# Patient Record
Sex: Male | Born: 1945 | Race: White | Hispanic: No | Marital: Married
Health system: Southern US, Community
[De-identification: ages and names within clinical notes are randomized; demographics above are authoritative.]

---

## 2016-01-29 ENCOUNTER — Inpatient Hospital Stay
Admission: AD | Admit: 2016-01-29 | Discharge: 2016-02-14 | Disposition: E | Payer: Self-pay | Source: Ambulatory Visit | Attending: Internal Medicine | Admitting: Internal Medicine

## 2016-01-29 ENCOUNTER — Other Ambulatory Visit (HOSPITAL_COMMUNITY): Payer: Self-pay

## 2016-01-29 DIAGNOSIS — I469 Cardiac arrest, cause unspecified: Secondary | ICD-10-CM

## 2016-01-29 DIAGNOSIS — Z431 Encounter for attention to gastrostomy: Secondary | ICD-10-CM

## 2016-01-29 DIAGNOSIS — J9690 Respiratory failure, unspecified, unspecified whether with hypoxia or hypercapnia: Secondary | ICD-10-CM

## 2016-01-29 MED ORDER — IOPAMIDOL (ISOVUE-300) INJECTION 61%: 50.0000 mL | Freq: Once | INTRAVENOUS | Status: DC | PRN

## 2016-01-29 MED ORDER — IOPAMIDOL (ISOVUE-300) INJECTION 61%
INTRAVENOUS | Status: AC
  Administered 2016-01-29: 50 mL via GASTROSTOMY
  Filled 2016-01-29: qty 50

## 2016-01-30 LAB — COMPREHENSIVE METABOLIC PANEL
ALT: 163 U/L — AB (ref 17–63)
AST: 66 U/L — AB (ref 15–41)
Albumin: 3.2 g/dL — ABNORMAL LOW (ref 3.5–5.0)
Alkaline Phosphatase: 121 U/L (ref 38–126)
Anion gap: 10 (ref 5–15)
BILIRUBIN TOTAL: 0.8 mg/dL (ref 0.3–1.2)
BUN: 43 mg/dL — ABNORMAL HIGH (ref 6–20)
CHLORIDE: 102 mmol/L (ref 101–111)
CO2: 27 mmol/L (ref 22–32)
CREATININE: 1.07 mg/dL (ref 0.61–1.24)
Calcium: 9 mg/dL (ref 8.9–10.3)
Glucose, Bld: 106 mg/dL — ABNORMAL HIGH (ref 65–99)
Potassium: 3.7 mmol/L (ref 3.5–5.1)
SODIUM: 139 mmol/L (ref 135–145)
TOTAL PROTEIN: 6.1 g/dL — AB (ref 6.5–8.1)

## 2016-01-30 LAB — CBC WITH DIFFERENTIAL/PLATELET
BASOS ABS: 0 10*3/uL (ref 0.0–0.1)
BASOS PCT: 0 %
EOS PCT: 0 %
Eosinophils Absolute: 0.1 10*3/uL (ref 0.0–0.7)
HCT: 29.3 % — ABNORMAL LOW (ref 39.0–52.0)
HEMOGLOBIN: 9.5 g/dL — AB (ref 13.0–17.0)
Lymphocytes Relative: 11 %
Lymphs Abs: 1.7 10*3/uL (ref 0.7–4.0)
MCH: 28.8 pg (ref 26.0–34.0)
MCHC: 32.4 g/dL (ref 30.0–36.0)
MCV: 88.8 fL (ref 78.0–100.0)
Monocytes Absolute: 0.7 10*3/uL (ref 0.1–1.0)
Monocytes Relative: 4 %
NEUTROS PCT: 85 %
Neutro Abs: 12.7 10*3/uL — ABNORMAL HIGH (ref 1.7–7.7)
PLATELETS: 204 10*3/uL (ref 150–400)
RBC: 3.3 MIL/uL — AB (ref 4.22–5.81)
RDW: 16.8 % — ABNORMAL HIGH (ref 11.5–15.5)
WBC: 15.1 10*3/uL — AB (ref 4.0–10.5)

## 2016-01-30 LAB — URINALYSIS, ROUTINE W REFLEX MICROSCOPIC
BILIRUBIN URINE: NEGATIVE
Glucose, UA: NEGATIVE mg/dL
Ketones, ur: NEGATIVE mg/dL
NITRITE: NEGATIVE
PH: 7 (ref 5.0–8.0)
Protein, ur: 30 mg/dL — AB
SPECIFIC GRAVITY, URINE: 1.018 (ref 1.005–1.030)

## 2016-01-30 LAB — MAGNESIUM: Magnesium: 2.2 mg/dL (ref 1.7–2.4)

## 2016-01-30 LAB — PROTIME-INR
INR: 1.07
PROTHROMBIN TIME: 13.9 s (ref 11.4–15.2)

## 2016-01-30 LAB — TSH: TSH: 23.877 u[IU]/mL — ABNORMAL HIGH (ref 0.350–4.500)

## 2016-01-30 LAB — PHOSPHORUS: Phosphorus: 3.6 mg/dL (ref 2.5–4.6)

## 2016-01-30 LAB — HEMOGLOBIN A1C
HEMOGLOBIN A1C: 6.3 % — AB (ref 4.8–5.6)
Mean Plasma Glucose: 134 mg/dL

## 2016-01-31 LAB — BASIC METABOLIC PANEL
ANION GAP: 9 (ref 5–15)
BUN: 52 mg/dL — AB (ref 6–20)
CO2: 29 mmol/L (ref 22–32)
Calcium: 8.6 mg/dL — ABNORMAL LOW (ref 8.9–10.3)
Chloride: 102 mmol/L (ref 101–111)
Creatinine, Ser: 1.09 mg/dL (ref 0.61–1.24)
GFR calc Af Amer: >60 mL/min (ref >60)
GLUCOSE: 121 mg/dL — AB (ref 65–99)
POTASSIUM: 3.6 mmol/L (ref 3.5–5.1)
Sodium: 140 mmol/L (ref 135–145)

## 2016-01-31 LAB — CBC WITH DIFFERENTIAL/PLATELET
Basophils Absolute: 0 10*3/uL (ref 0.0–0.1)
Basophils Relative: 0 %
EOS PCT: 1 %
Eosinophils Absolute: 0.1 10*3/uL (ref 0.0–0.7)
HEMATOCRIT: 28.4 % — AB (ref 39.0–52.0)
Hemoglobin: 9.2 g/dL — ABNORMAL LOW (ref 13.0–17.0)
LYMPHS PCT: 14 %
Lymphs Abs: 1.6 10*3/uL (ref 0.7–4.0)
MCH: 28.8 pg (ref 26.0–34.0)
MCHC: 32.4 g/dL (ref 30.0–36.0)
MCV: 88.8 fL (ref 78.0–100.0)
MONO ABS: 0.6 10*3/uL (ref 0.1–1.0)
Monocytes Relative: 5 %
NEUTROS ABS: 9.7 10*3/uL — AB (ref 1.7–7.7)
Neutrophils Relative %: 80 %
PLATELETS: 224 10*3/uL (ref 150–400)
RBC: 3.2 MIL/uL — AB (ref 4.22–5.81)
RDW: 17 % — ABNORMAL HIGH (ref 11.5–15.5)
WBC: 12 10*3/uL — AB (ref 4.0–10.5)

## 2016-01-31 LAB — T4, FREE: FREE T4: 0.93 ng/dL (ref 0.61–1.12)

## 2016-01-31 LAB — PHOSPHORUS: Phosphorus: 3.2 mg/dL (ref 2.5–4.6)

## 2016-01-31 LAB — MAGNESIUM: Magnesium: 2.5 mg/dL — ABNORMAL HIGH (ref 1.7–2.4)

## 2016-02-02 ENCOUNTER — Other Ambulatory Visit (HOSPITAL_COMMUNITY): Payer: Self-pay

## 2016-02-02 LAB — URINE CULTURE: Culture: >=100000 — AB

## 2016-02-06 MED FILL — Medication: Qty: 1 | Status: AC

## 2016-02-14 NOTE — Progress Notes (Signed)
CODE BLUE NOTE  Patient Name: Benjamin Mata  MRN: [id number]  Date of Birth/ Sex: Jul 04, 1945     Admission Date: 01/25/2016  Attending Provider: Carron CurieAli, Hijazi, MD   Primary Diagnosis: CVA    Indication: Pt was in her usual state of health until this PM, when she was noted to be bradycardic. Code blue was subsequently called. At the time of arrival on scene, ACLS protocol was underway.   Technical Description:  - CPR performance duration:  30 minute  - Was defibrillation or cardioversion used? Yes   - Was external pacer placed? Yes  - Was patient intubated pre/post CPR? Yes    Medications Administered: Y = Yes; Blank = No Amiodarone    Atropine    Calcium  1  Epinephrine  8  Lidocaine    Magnesium  1  Norepinephrine    Phenylephrine    Sodium bicarbonate  3  Vasopressin    Other     Post CPR evaluation:  - Final Status - Was patient successfully resuscitated ? No   Miscellaneous Information:  - Time of death:  5:50 AM  - Primary team notified?  Yes  - Family Notified? Yes       [name], MD Family Medicine, PGY-1 6:20 AM

## 2016-02-14 DEATH — deceased

## 2017-07-31 IMAGING — CR DG CHEST 1V PORT
1 series · 1 of 1 positions shown · non-contrast
Comparison: None.

CLINICAL DATA: PEG tube placement.

EXAM:
PORTABLE CHEST 1 VIEW

[AP]
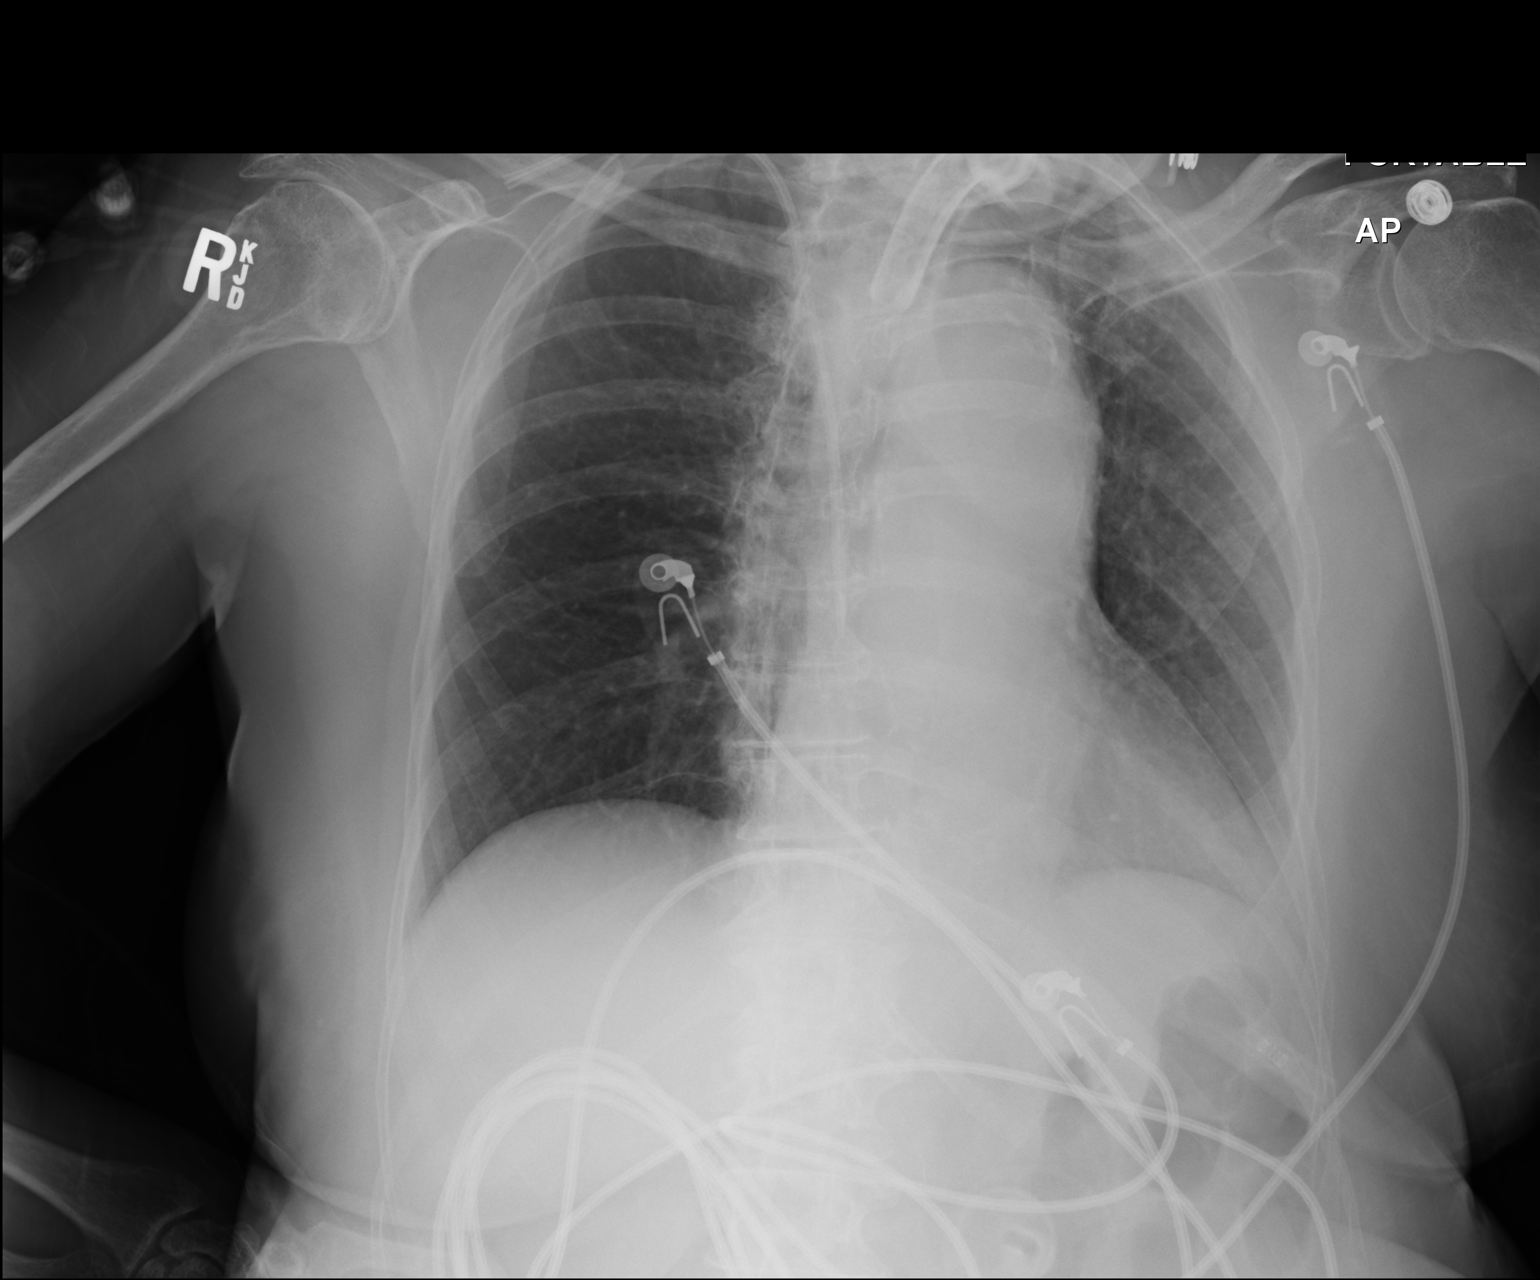

[1 of 1 positions shown; findings below may reference images not displayed]

FINDINGS: Heart is top normal. There is aortic atherosclerosis. Probable
ectasia of the thoracic aorta along its descending portion. Mild
aneurysmal dilatation not entirely excluded. Patient is slightly
rotated on current exam. Tracheostomy tube tip is seen along the
expected course of the trachea at the level of the aortic arch.
Right-sided central line catheter is seen in the distal SVC. Lungs
are free of pneumonic consolidations. No pneumothorax nor effusion.
PEG tube partially imaged in the left upper quadrant of the abdomen.
IMPRESSION: Satisfactory support line and tube positions. No acute pulmonary
disease. Aortic atherosclerosis. Ectatic thoracic aorta. Mild
aneurysmal dilatation of the descending portion not entirely
excluded.

## 2017-07-31 IMAGING — CR DG ABD PORTABLE 1V
1 series · 1 of 1 positions shown · non-contrast
Comparison: None.

CLINICAL DATA: PEG tube placement.

EXAM:
PORTABLE ABDOMEN - 1 VIEW

[AP]
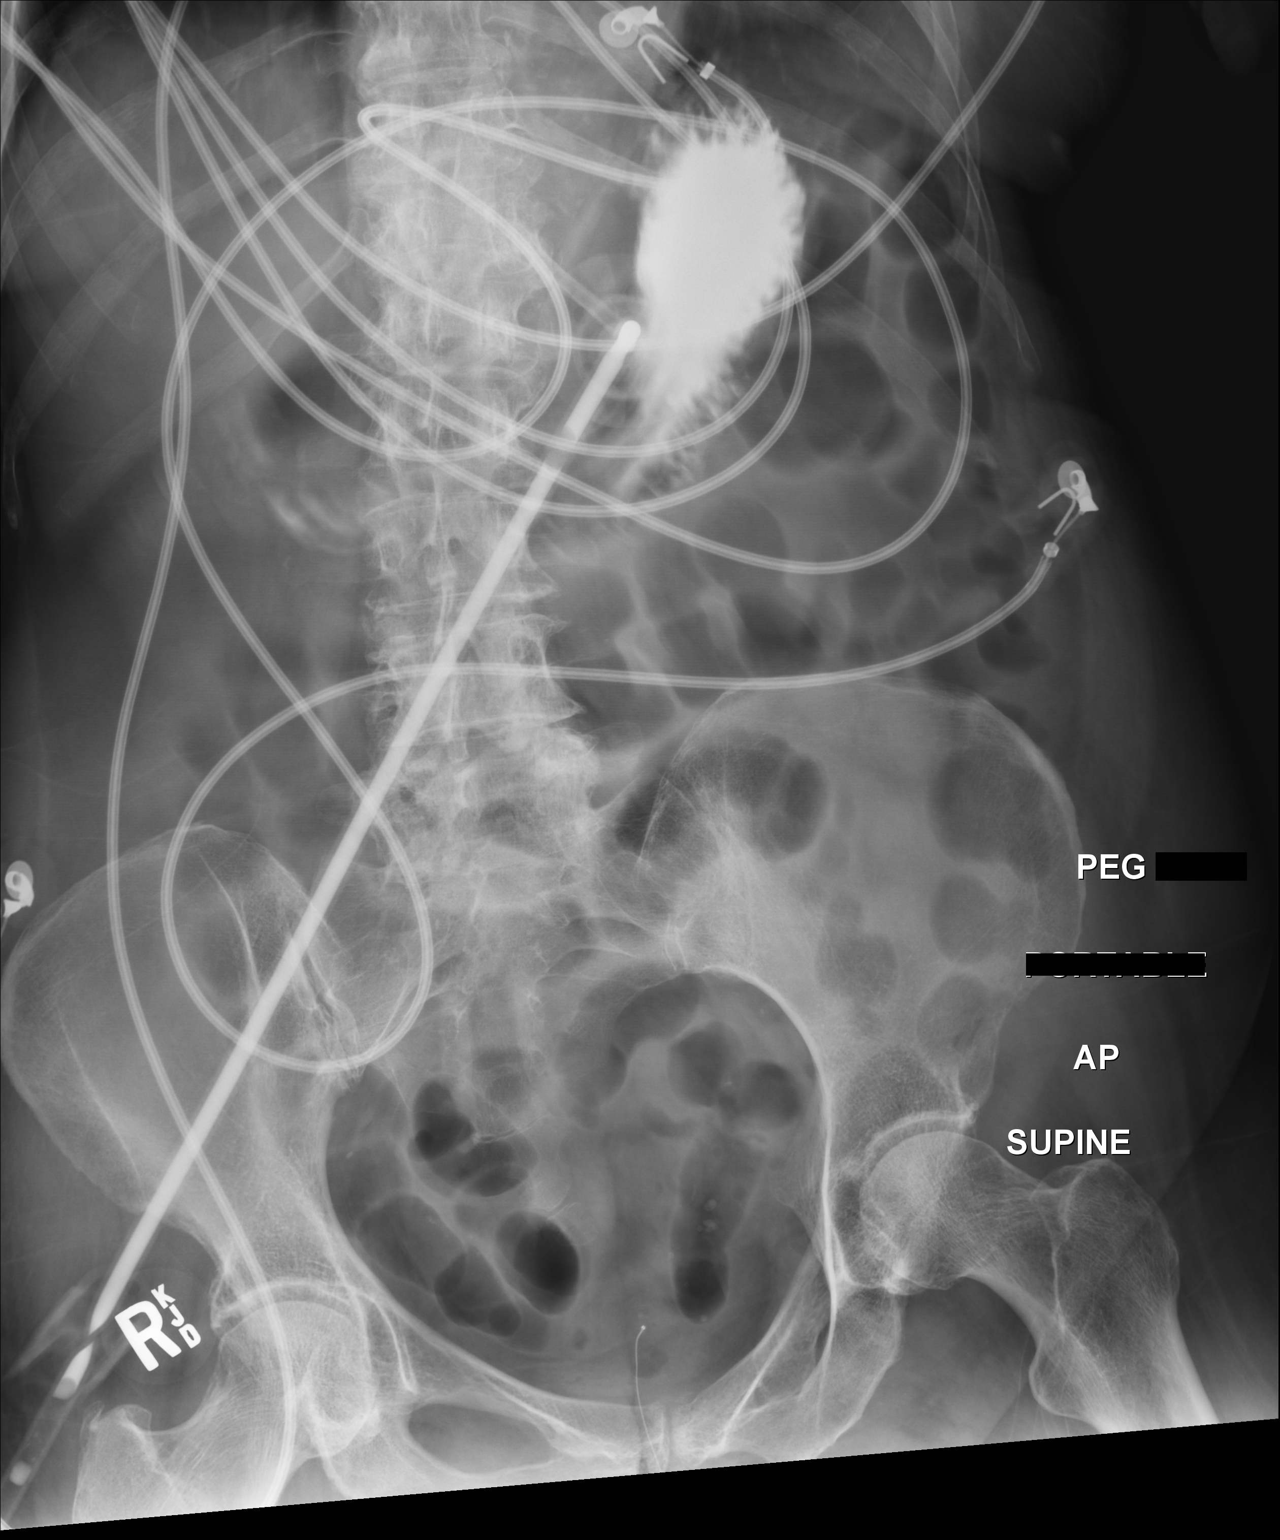

[1 of 1 positions shown; findings below may reference images not displayed]

FINDINGS: The bowel gas pattern is normal. Approximately 50 cc of Isovue 370
was injected via the patient's new indwelling PEG tube demonstrating
opacification of the lumen of the proximal stomach. No radio-opaque
calculi or other significant radiographic abnormality are seen.
IMPRESSION: Intraluminal positioning type 2 tip in the expected location of the
stomach at the level of the gastric body. No extravasation of
contrast is noted.
